# Patient Record
Sex: Male | Born: 1975 | Race: White | Hispanic: No | Marital: Married | Smoking: Current every day smoker
Health system: Southern US, Community
[De-identification: ages and names within clinical notes are randomized; demographics above are authoritative.]

---

## 1999-05-18 ENCOUNTER — Emergency Department (HOSPITAL_COMMUNITY): Admission: EM | Admit: 1999-05-18 | Discharge: 1999-05-18 | Payer: Self-pay | Admitting: Emergency Medicine

## 1999-05-18 ENCOUNTER — Encounter: Payer: Self-pay | Admitting: Emergency Medicine

## 1999-05-26 ENCOUNTER — Emergency Department (HOSPITAL_COMMUNITY): Admission: EM | Admit: 1999-05-26 | Discharge: 1999-05-26 | Payer: Self-pay | Admitting: Emergency Medicine

## 1999-06-08 ENCOUNTER — Emergency Department (HOSPITAL_COMMUNITY): Admission: EM | Admit: 1999-06-08 | Discharge: 1999-06-08 | Payer: Self-pay

## 1999-06-11 ENCOUNTER — Emergency Department (HOSPITAL_COMMUNITY): Admission: EM | Admit: 1999-06-11 | Discharge: 1999-06-11 | Payer: Self-pay

## 2002-04-21 ENCOUNTER — Emergency Department (HOSPITAL_COMMUNITY): Admission: EM | Admit: 2002-04-21 | Discharge: 2002-04-21 | Payer: Self-pay | Admitting: Emergency Medicine

## 2002-07-16 ENCOUNTER — Emergency Department (HOSPITAL_COMMUNITY): Admission: EM | Admit: 2002-07-16 | Discharge: 2002-07-16 | Payer: Self-pay | Admitting: Emergency Medicine

## 2003-07-04 ENCOUNTER — Emergency Department (HOSPITAL_COMMUNITY): Admission: EM | Admit: 2003-07-04 | Discharge: 2003-07-04 | Payer: Self-pay | Admitting: Emergency Medicine

## 2004-04-09 ENCOUNTER — Emergency Department (HOSPITAL_COMMUNITY): Admission: EM | Admit: 2004-04-09 | Discharge: 2004-04-09 | Payer: Self-pay | Admitting: *Deleted

## 2004-09-29 ENCOUNTER — Emergency Department: Payer: Self-pay | Admitting: Emergency Medicine

## 2005-05-28 ENCOUNTER — Encounter: Admission: RE | Admit: 2005-05-28 | Discharge: 2005-05-28 | Payer: Self-pay | Admitting: Occupational Medicine

## 2009-04-01 ENCOUNTER — Ambulatory Visit: Payer: Self-pay | Admitting: Family Medicine

## 2009-04-01 DIAGNOSIS — F329 Major depressive disorder, single episode, unspecified: Secondary | ICD-10-CM

## 2009-04-01 DIAGNOSIS — F17200 Nicotine dependence, unspecified, uncomplicated: Secondary | ICD-10-CM

## 2009-04-01 DIAGNOSIS — L0591 Pilonidal cyst without abscess: Secondary | ICD-10-CM | POA: Insufficient documentation

## 2009-05-04 ENCOUNTER — Ambulatory Visit: Payer: Self-pay | Admitting: Family Medicine

## 2009-05-05 ENCOUNTER — Encounter: Payer: Self-pay | Admitting: Family Medicine

## 2009-10-04 ENCOUNTER — Telehealth: Payer: Self-pay | Admitting: Family Medicine

## 2010-07-21 ENCOUNTER — Emergency Department (HOSPITAL_BASED_OUTPATIENT_CLINIC_OR_DEPARTMENT_OTHER): Admission: EM | Admit: 2010-07-21 | Discharge: 2010-07-22 | Payer: Self-pay | Admitting: Emergency Medicine

## 2010-07-24 ENCOUNTER — Encounter: Payer: Self-pay | Admitting: Family Medicine

## 2011-01-16 NOTE — Letter (Signed)
Summary: [facility]   Imported By: Lanelle Bal 08/08/2010 08:59:08  _____________________________________________________________________  External Attachment:    Type:   Image     Comment:   External Document

## 2011-03-02 LAB — CULTURE, ROUTINE-ABSCESS

## 2011-09-03 ENCOUNTER — Emergency Department: Payer: Self-pay | Admitting: Emergency Medicine

## 2012-04-27 IMAGING — CR DG CHEST 1V PORT
1 series · 1 of 1 positions shown · non-contrast
Comparison: none

REASON FOR EXAM: chest pain
COMMENTS:

PROCEDURE:     DXR - DXR PORTABLE CHEST SINGLE VIEW  - September 03, 2011 [DATE]
RESULT:     The lungs are mildly hypoinflated. There is no focal infiltrate.
The cardiac silhouette is normal in size. The pulmonary vascularity is not
engorged. I see no pleural effusion.

[view not recorded]
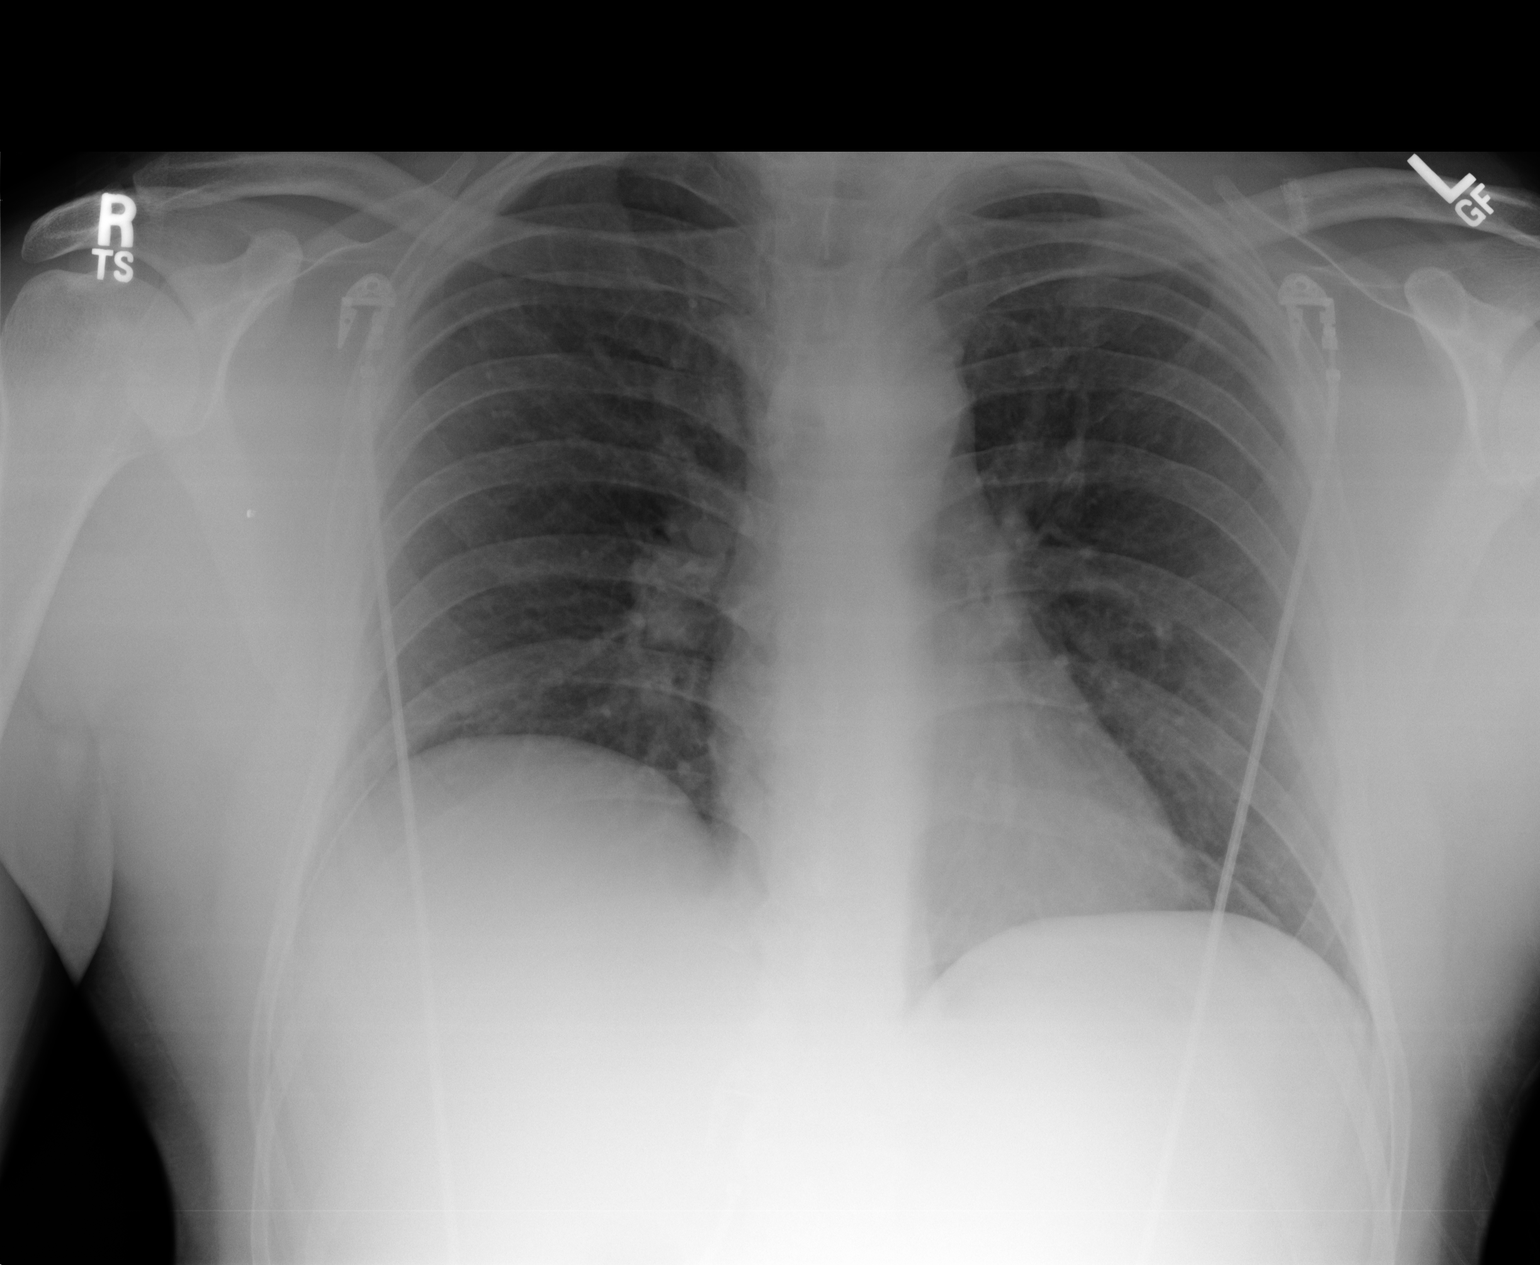

[1 of 1 positions shown; findings below may reference images not displayed]

IMPRESSION: I do not see evidence of acute cardiopulmonary abnormality.
The study is limited due to hypoinflation. Followup PA and lateral chest
films with deep inspiration would be useful if the patient's symptoms
persist.

## 2012-04-28 IMAGING — CT CT CHEST W/ CM
2 series · 15 of 31 positions shown, 19 images · IV contrast (APPLIED)
Comparison: none

REASON FOR EXAM: chest pain tachycardia
COMMENTS:

[Series 4: soft tissue · axial · 0.74mm/px · z∈[-90,-42]mm · 2 of 103 slices shown]
[im 8/103  mediastinal]
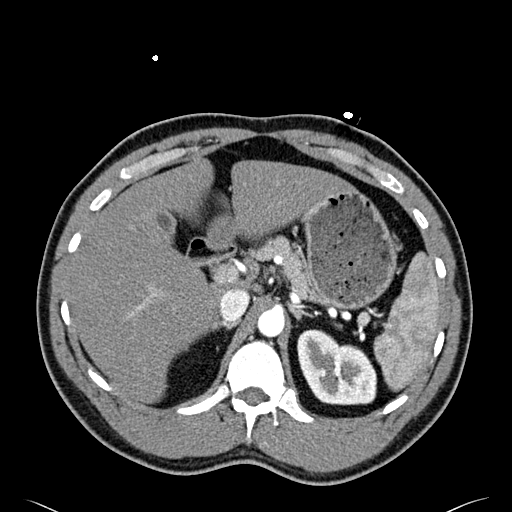
[im 24/103  mediastinal]
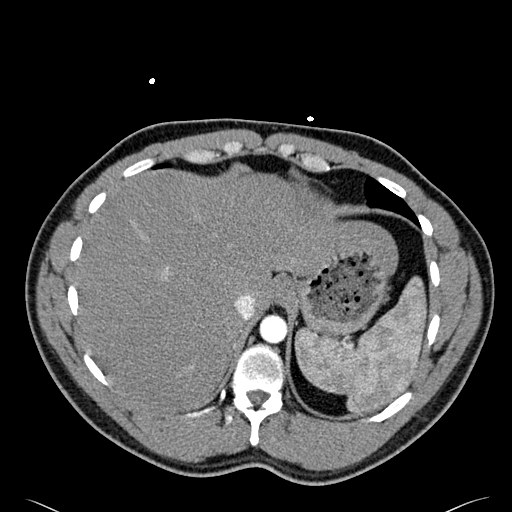

[Series 5: lung windows · axial · 0.74mm/px · z∈[-82,+170]mm · 13 of 100 slices shown, 17 images]
[im 8/100  mediastinal]
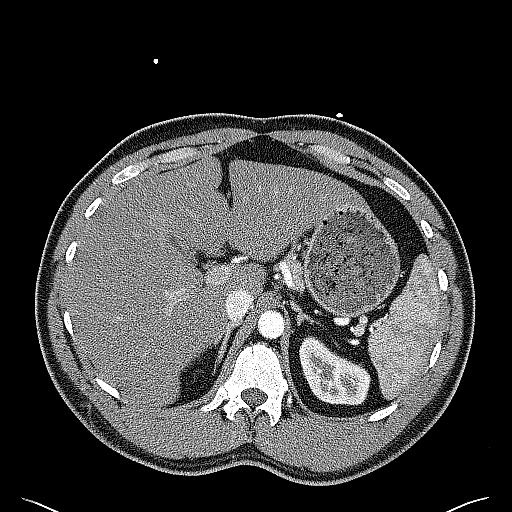
[im 8/100  lung]
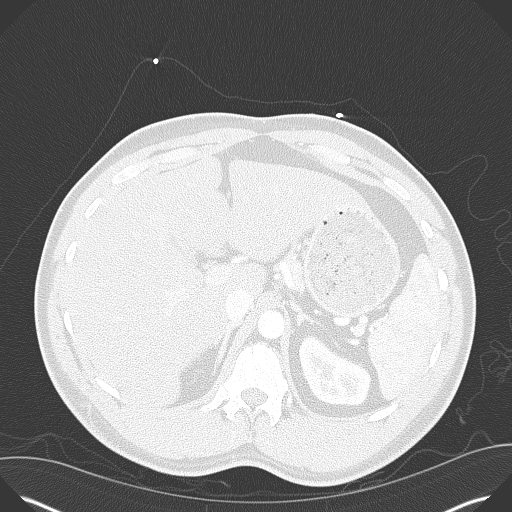
[im 16/100  lung]
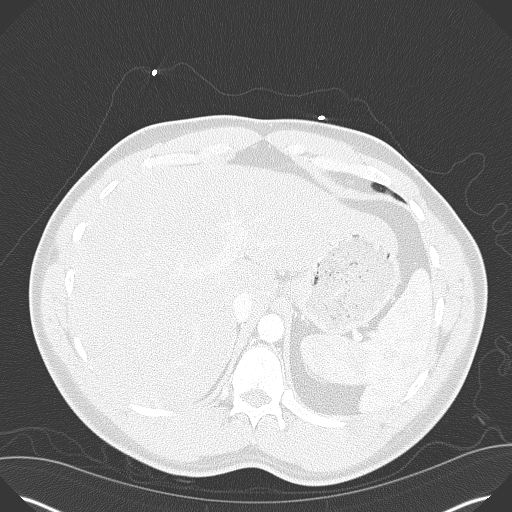
[im 23/100  lung]
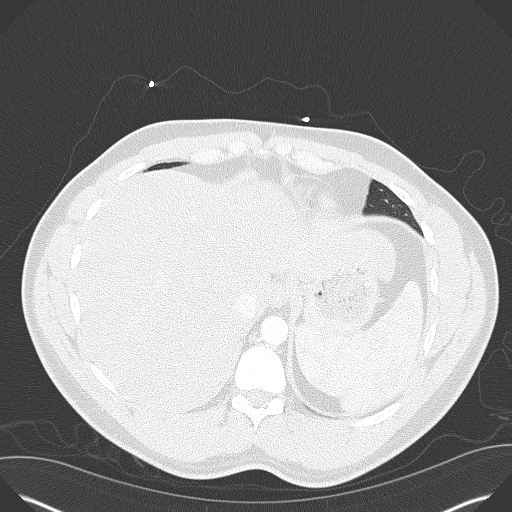
[im 31/100  lung]
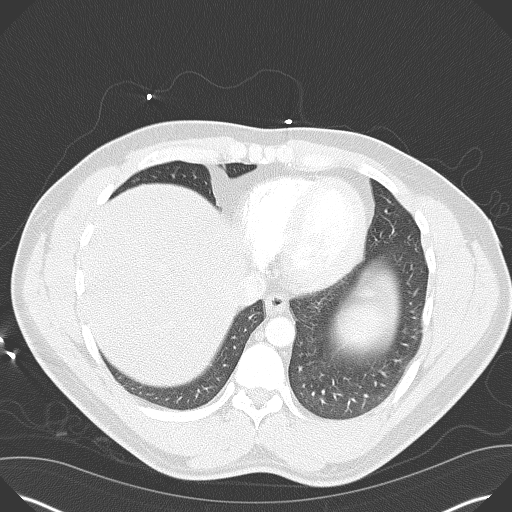
[im 39/100  mediastinal]
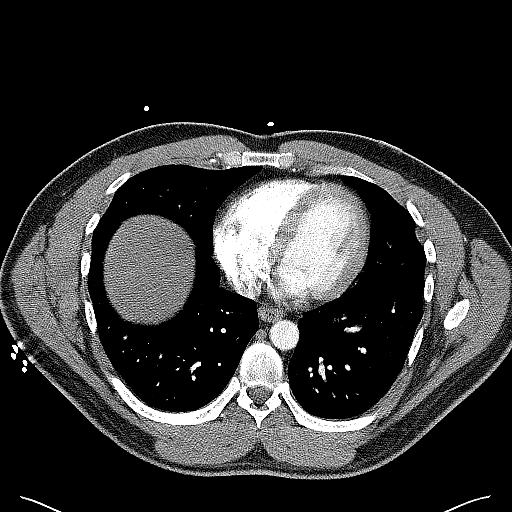
[im 39/100  lung]
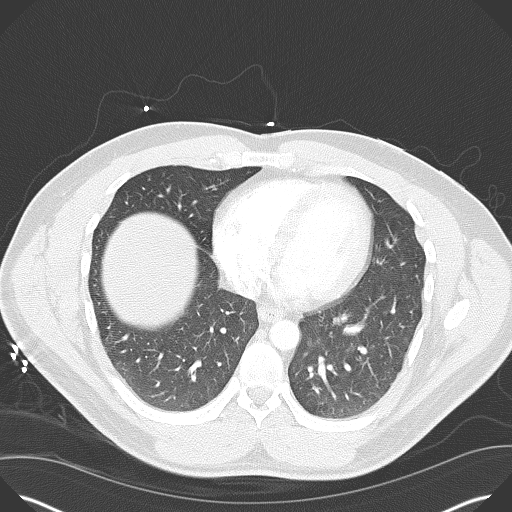
[im 46/100  lung]
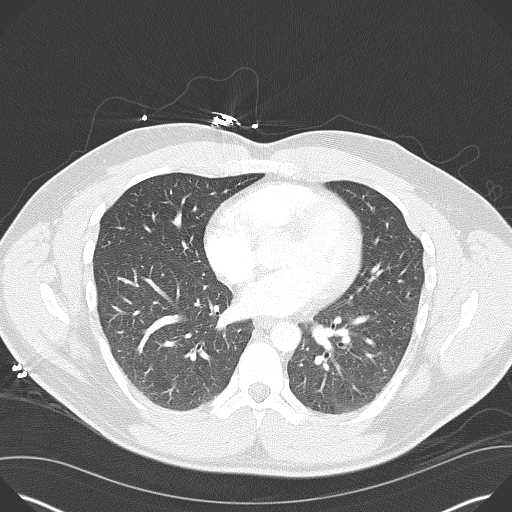
[im 50/100  lung]
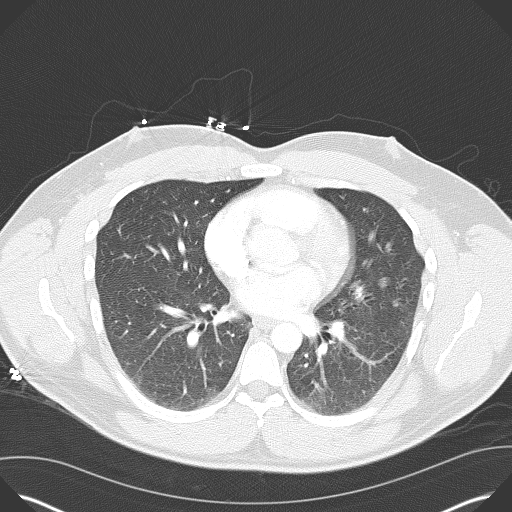
[im 54/100  lung]
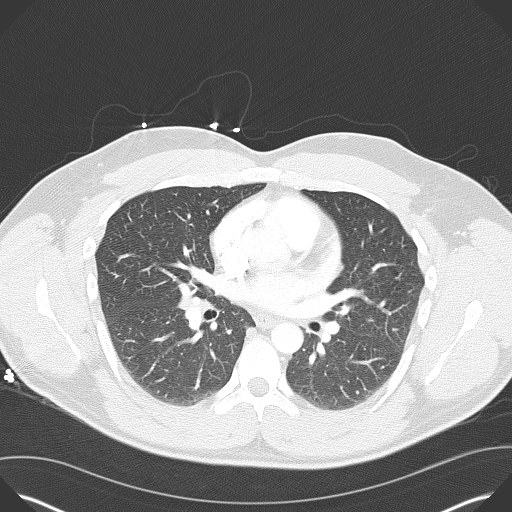
[im 61/100  mediastinal]
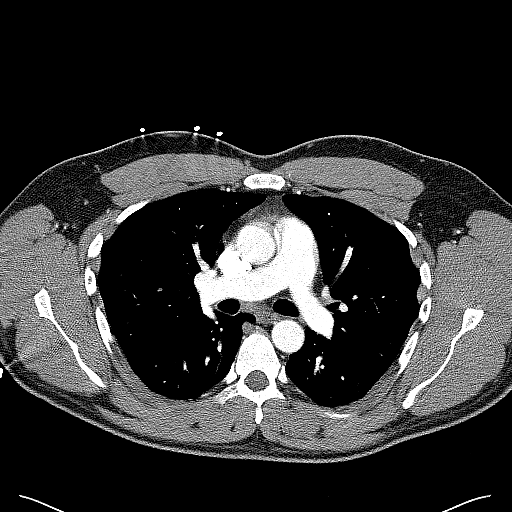
[im 61/100  lung]
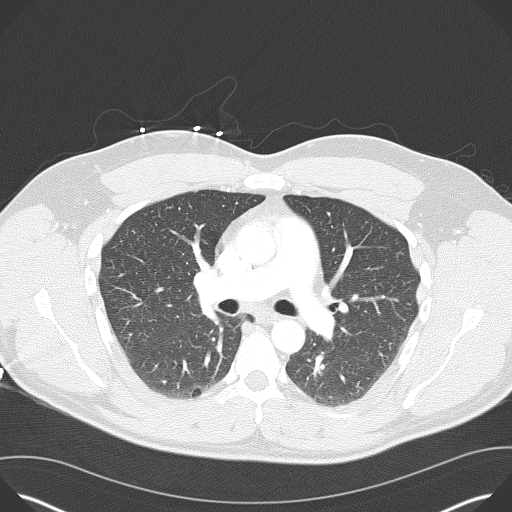
[im 69/100  lung]
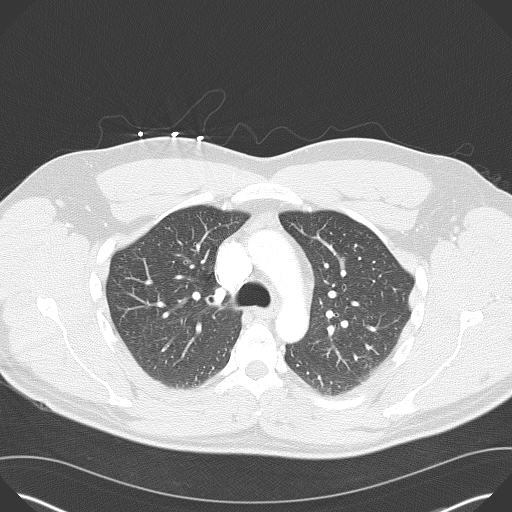
[im 77/100  lung]
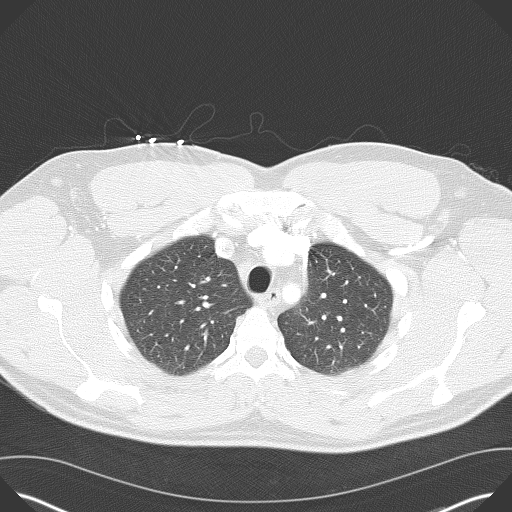
[im 84/100  lung]
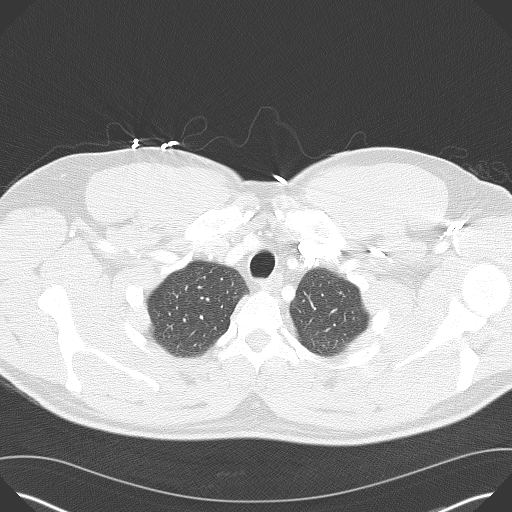
[im 92/100  mediastinal]
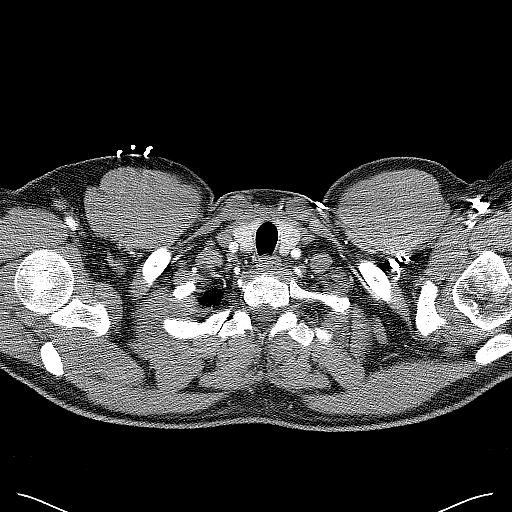
[im 92/100  lung]
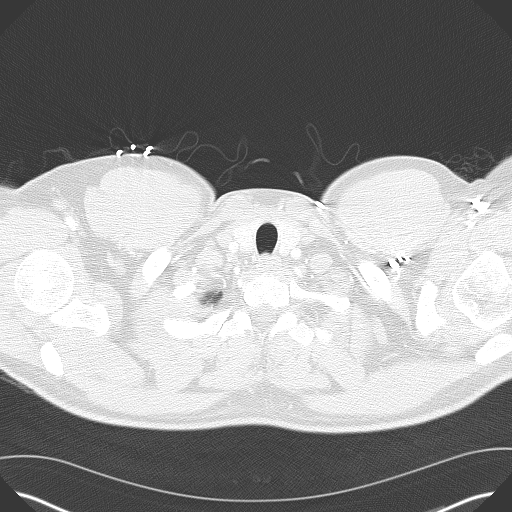

[15 of 31 positions shown; findings below may reference images not displayed]

PROCEDURE:     CT  - CT CHEST (FOR PE) W  - September 04, 2011 [DATE]

RESULT:     Emergent CT of the chest is performed utilizing 100 mL of
[id number] iodinated intravenous contrast with images reconstructed at
mm slice thickness in the axial plane. The patient has no previous exam for
comparison.

Dependent atelectasis is present. There is a 2 mm subpleural nodular density
anteriorly in the right lung on image #46 with smooth margins and no CT
evidence of calcification. The thoracic aorta is normal in caliber without
dissection. The pulmonary arteries demonstrate no evidence of filling
defect. There is no pleural or pericardial effusion. The upper abdominal
viscera included on the study appear within normal limits. The bony
structures are unremarkable.
IMPRESSION: 1. Minimal atelectasis in the lung bases. Tiny subpleural nodular density as
described. Correlate with risk factors. Note is also made but not mentioned
above of a small bleb in the subpleural region on image #41 in the right
lung posteriorly. This measures 7.0 mm in diameter.
2. No pulmonary embolism.
3. No thoracic aortic aneurysm or dissection.
4. No mediastinal or hilar mass or adenopathy.

## 2015-01-06 ENCOUNTER — Emergency Department (HOSPITAL_COMMUNITY): Payer: Self-pay

## 2015-01-06 ENCOUNTER — Encounter (HOSPITAL_COMMUNITY): Payer: Self-pay | Admitting: *Deleted

## 2015-01-06 ENCOUNTER — Emergency Department (HOSPITAL_COMMUNITY)
Admission: EM | Admit: 2015-01-06 | Discharge: 2015-01-07 | Disposition: A | Discharge status: Home / Self Care | Payer: Self-pay | Attending: Emergency Medicine | Admitting: Emergency Medicine

## 2015-01-06 DIAGNOSIS — R739 Hyperglycemia, unspecified: Secondary | ICD-10-CM | POA: Insufficient documentation

## 2015-01-06 DIAGNOSIS — R0789 Other chest pain: Secondary | ICD-10-CM | POA: Insufficient documentation

## 2015-01-06 DIAGNOSIS — Z720 Tobacco use: Secondary | ICD-10-CM | POA: Insufficient documentation

## 2015-01-06 DIAGNOSIS — R112 Nausea with vomiting, unspecified: Secondary | ICD-10-CM | POA: Insufficient documentation

## 2015-01-06 DIAGNOSIS — R079 Chest pain, unspecified: Secondary | ICD-10-CM

## 2015-01-06 DIAGNOSIS — R197 Diarrhea, unspecified: Secondary | ICD-10-CM | POA: Insufficient documentation

## 2015-01-06 LAB — I-STAT TROPONIN, ED
TROPONIN I, POC: 0 ng/mL (ref 0.00–0.08)
TROPONIN I, POC: 0 ng/mL (ref 0.00–0.08)

## 2015-01-06 LAB — URINALYSIS, ROUTINE W REFLEX MICROSCOPIC
Bilirubin Urine: NEGATIVE
Glucose, UA: >1000 mg/dL — AB
Hgb urine dipstick: NEGATIVE
Ketones, ur: NEGATIVE mg/dL
Leukocytes, UA: NEGATIVE
Nitrite: NEGATIVE
PH: 5 (ref 5.0–8.0)
Protein, ur: NEGATIVE mg/dL
Specific Gravity, Urine: 1.036 — ABNORMAL HIGH (ref 1.005–1.030)
Urobilinogen, UA: 0.2 mg/dL (ref 0.0–1.0)

## 2015-01-06 LAB — HEPATIC FUNCTION PANEL
ALT: 54 U/L — ABNORMAL HIGH (ref 0–53)
AST: 36 U/L (ref 0–37)
Albumin: 4.5 g/dL (ref 3.5–5.2)
Alkaline Phosphatase: 118 U/L — ABNORMAL HIGH (ref 39–117)
Bilirubin, Direct: 0.2 mg/dL (ref 0.0–0.5)
Indirect Bilirubin: 0.9 mg/dL (ref 0.3–0.9)
Total Bilirubin: 1.1 mg/dL (ref 0.3–1.2)
Total Protein: 7.2 g/dL (ref 6.0–8.3)

## 2015-01-06 LAB — URINE MICROSCOPIC-ADD ON

## 2015-01-06 LAB — CBC
HCT: 48.7 % (ref 39.0–52.0)
HEMOGLOBIN: 17.7 g/dL — AB (ref 13.0–17.0)
MCH: 30.5 pg (ref 26.0–34.0)
MCHC: 36.3 g/dL — AB (ref 30.0–36.0)
MCV: 84 fL (ref 78.0–100.0)
PLATELETS: 219 10*3/uL (ref 150–400)
RBC: 5.8 MIL/uL (ref 4.22–5.81)
RDW: 13 % (ref 11.5–15.5)
WBC: 13.7 10*3/uL — ABNORMAL HIGH (ref 4.0–10.5)

## 2015-01-06 LAB — CBG MONITORING, ED
Glucose-Capillary: 216 mg/dL — ABNORMAL HIGH (ref 70–99)
Glucose-Capillary: 221 mg/dL — ABNORMAL HIGH (ref 70–99)

## 2015-01-06 LAB — LIPASE, BLOOD: LIPASE: 48 U/L (ref 11–59)

## 2015-01-06 LAB — DIFFERENTIAL
BASOS ABS: 0 10*3/uL (ref 0.0–0.1)
Basophils Relative: 0 % (ref 0–1)
EOS ABS: 0.1 10*3/uL (ref 0.0–0.7)
Eosinophils Relative: 0 % (ref 0–5)
Lymphocytes Relative: 7 % — ABNORMAL LOW (ref 12–46)
Lymphs Abs: 0.8 10*3/uL (ref 0.7–4.0)
Monocytes Absolute: 0.4 10*3/uL (ref 0.1–1.0)
Monocytes Relative: 4 % (ref 3–12)
Neutro Abs: 10.1 10*3/uL — ABNORMAL HIGH (ref 1.7–7.7)
Neutrophils Relative %: 89 % — ABNORMAL HIGH (ref 43–77)

## 2015-01-06 LAB — BRAIN NATRIURETIC PEPTIDE: B Natriuretic Peptide: 18.7 pg/mL (ref 0.0–100.0)

## 2015-01-06 LAB — BASIC METABOLIC PANEL
ANION GAP: 11 (ref 5–15)
BUN: 8 mg/dL (ref 6–23)
CO2: 26 mmol/L (ref 19–32)
CREATININE: 1.22 mg/dL (ref 0.50–1.35)
Calcium: 9.5 mg/dL (ref 8.4–10.5)
Chloride: 98 mEq/L (ref 96–112)
GFR calc non Af Amer: 75 mL/min — ABNORMAL LOW (ref >90)
GFR, EST AFRICAN AMERICAN: 87 mL/min — AB (ref >90)
GLUCOSE: 226 mg/dL — AB (ref 70–99)
POTASSIUM: 3.1 mmol/L — AB (ref 3.5–5.1)
SODIUM: 135 mmol/L (ref 135–145)

## 2015-01-06 MED ORDER — SODIUM CHLORIDE 0.9 % IV BOLUS (SEPSIS)
1000.0000 mL | Freq: Once | INTRAVENOUS | Status: AC
  Administered 2015-01-06: 1000 mL via INTRAVENOUS

## 2015-01-06 MED ORDER — ONDANSETRON HCL 4 MG/2ML IJ SOLN
4.0000 mg | Freq: Once | INTRAMUSCULAR | Status: AC
  Administered 2015-01-06: 4 mg via INTRAVENOUS
  Filled 2015-01-06: qty 2

## 2015-01-06 MED ORDER — KETOROLAC TROMETHAMINE 30 MG/ML IJ SOLN
30.0000 mg | Freq: Once | INTRAMUSCULAR | Status: AC
  Administered 2015-01-06: 30 mg via INTRAVENOUS
  Filled 2015-01-06: qty 1

## 2015-01-06 NOTE — ED Notes (Signed)
Pt c/o centralized chest pain radiating to back of neck. Pt also reports nausea, shortness of breath and dizziness. Pt was watching a movie when onset occurred.

## 2015-01-06 NOTE — ED Provider Notes (Signed)
TIME SEEN: 8:53 PM  CHIEF COMPLAINT: Nausea, vomiting, diarrhea, chest pain and shortness of breath  HPI: Pt is a 39 y.o. male with history of hyperglycemia that is diet controlled who presents to the emergency Department nausea, vomiting, diarrhea that started earlier today. Reports he was in a movie theater around 8 PM when he felt very nauseous and this made him panic and he began having chest tightness and shortness of breath. States he has felt lightheaded with standing. Denies abdominal pain. No fevers or chills. No sick contacts or recent travel. No dysuria or hematuria. No bloody stool or melena. Reports he has had several weeks of polydipsia, polyuria. No history of cardiac disease. He is a smoker. No history of hypertension, hyperlipidemia. No prior history of PE or DVT, recent prolonged immobilization, fracture, surgery, trauma. No leg swelling or pain.  ROS: See HPI Constitutional: no fever  Eyes: no drainage  ENT: no runny nose   Cardiovascular:  chest pain  Resp:  SOB  GI:  vomiting GU: no dysuria Integumentary: no rash  Allergy: no hives  Musculoskeletal: no leg swelling  Neurological: no slurred speech ROS otherwise negative  PAST MEDICAL HISTORY/PAST SURGICAL HISTORY:  History reviewed. No pertinent past medical history.  MEDICATIONS:  Prior to Admission medications   Not on File    ALLERGIES:  Allergies  Allergen Reactions  . Benadryl [Diphenhydramine Hcl (Sleep)] Hives    Allergic to red dye    SOCIAL HISTORY:  History  Substance Use Topics  . Smoking status: Current Every Day Smoker  . Smokeless tobacco: Never Used  . Alcohol Use: No    FAMILY HISTORY: History reviewed. No pertinent family history.  EXAM: BP 112/66 mmHg  Pulse 86  Temp(Src) 98.6 F (37 C) (Oral)  Resp 16  SpO2 100% CONSTITUTIONAL: Alert and oriented and responds appropriately to questions. Well-appearing; well-nourished HEAD: Normocephalic EYES: Conjunctivae clear,  PERRL ENT: normal nose; no rhinorrhea; moist mucous membranes; pharynx without lesions noted NECK: Supple, no meningismus, no LAD  CARD: RRR; S1 and S2 appreciated; no murmurs, no clicks, no rubs, no gallops RESP: Normal chest excursion without splinting or tachypnea; breath sounds clear and equal bilaterally; no wheezes, no rhonchi, no rales,  ABD/GI: Normal bowel sounds; non-distended; soft, non-tender, no rebound, no guarding BACK:  The back appears normal and is non-tender to palpation, there is no CVA tenderness EXT: Normal ROM in all joints; non-tender to palpation; no edema; normal capillary refill; no cyanosis; no calf tenderness or swelling  SKIN: Normal color for age and race; warm NEURO: Moves all extremities equally PSYCH: The patient's mood and manner are appropriate. Grooming and personal hygiene are appropriate.  MEDICAL DECISION MAKING: Patient here with nausea, vomiting and diarrhea with a benign abdominal exam. Suspect viral illness. Suspect that he began to feel anxious because of these symptoms and that is causing his chest pain or shortness of breath. He does have some T-wave inversions in leads 3 and aVF but no other ischemic changes. He has no risk factors for ACS other than tobacco use and history of diet-controlled hyperglycemia. Blood glucose here is mildly elevated. Will give IV fluids, Toradol, Zofran and reassess. We'll check basic labs, urine.  Given his abdominal exam is benign I do not feel he needs abdominal imaging at this time.  ED PROGRESS: Patient's labs show mild leukocytosis with left shift. Abdominal exam still benign. Blood glucose mildly elevated but bicarbonate and anion gap normal. Urine shows no sign of infection and no  ketones. Reports feeling much better after IV fluids, Toradol and Zofran. Is tolerating by mouth. He has had 2 negative troponins and a clear chest x-ray. I feel he is safe to be discharged home. Given he is hyperglycemic we'll start him on  metformin 500 mg twice a day. He does have a primary care physician for follow-up. Have advised him to schedule an appointment for next week. Discussed return precautions. We'll discharge with prescriptions for Phenergan, Zofran, Imodium and metformin. Patient and significant other at bedside verbalize understanding and are comfortable with plan.    Date: 01/06/2015 20:31  Rate: 93  Rhythm: normal sinus rhythm  QRS Axis: normal  Intervals: normal  ST/T Wave abnormalities: normal  Conduction Disutrbances: none  Narrative Interpretation: Sinus rhythm, T-wave inversions in III and aVF      Layla MawKristen N Nickie Warwick, DO 01/07/15 0102

## 2015-01-07 MED ORDER — ONDANSETRON 4 MG PO TBDP: 4.0000 mg | ORAL_TABLET | Freq: Three times a day (TID) | ORAL | Status: AC | PRN

## 2015-01-07 MED ORDER — PROMETHAZINE HCL 25 MG PO TABS: 25.0000 mg | ORAL_TABLET | Freq: Four times a day (QID) | ORAL | Status: AC | PRN

## 2015-01-07 MED ORDER — LOPERAMIDE HCL 2 MG PO CAPS: 2.0000 mg | ORAL_CAPSULE | Freq: Four times a day (QID) | ORAL | Status: AC | PRN

## 2015-01-07 MED ORDER — METFORMIN HCL 500 MG PO TABS: 500.0000 mg | ORAL_TABLET | Freq: Two times a day (BID) | ORAL | Status: AC

## 2015-01-07 NOTE — Discharge Instructions (Signed)
Viral Gastroenteritis °Viral gastroenteritis is also known as stomach flu. This condition affects the stomach and intestinal tract. It can cause sudden diarrhea and vomiting. The illness typically lasts 3 to 8 days. Most people develop an immune response that eventually gets rid of the virus. While this natural response develops, the virus can make you quite ill. °CAUSES  °Many different viruses can cause gastroenteritis, such as rotavirus or noroviruses. You can catch one of these viruses by consuming contaminated food or water. You may also catch a virus by sharing utensils or other personal items with an infected person or by touching a contaminated surface. °SYMPTOMS  °The most common symptoms are diarrhea and vomiting. These problems can cause a severe loss of body fluids (dehydration) and a body salt (electrolyte) imbalance. Other symptoms may include: °· Fever. °· Headache. °· Fatigue. °· Abdominal pain. °DIAGNOSIS  °Your caregiver can usually diagnose viral gastroenteritis based on your symptoms and a physical exam. A stool sample may also be taken to test for the presence of viruses or other infections. °TREATMENT  °This illness typically goes away on its own. Treatments are aimed at rehydration. The most serious cases of viral gastroenteritis involve vomiting so severely that you are not able to keep fluids down. In these cases, fluids must be given through an intravenous line (IV). °HOME CARE INSTRUCTIONS  °· Drink enough fluids to keep your urine clear or pale yellow. Drink small amounts of fluids frequently and increase the amounts as tolerated. °· Ask your caregiver for specific rehydration instructions. °· Avoid: °· Foods high in sugar. °· Alcohol. °· Carbonated drinks. °· Tobacco. °· Juice. °· Caffeine drinks. °· Extremely hot or cold fluids. °· Fatty, greasy foods. °· Too much intake of anything at one time. °· Dairy products until 24 to 48 hours after diarrhea stops. °· You may consume probiotics.  Probiotics are active cultures of beneficial bacteria. They may lessen the amount and number of diarrheal stools in adults. Probiotics can be found in yogurt with active cultures and in supplements. °· Wash your hands well to avoid spreading the virus. °· Only take over-the-counter or prescription medicines for pain, discomfort, or fever as directed by your caregiver. Do not give aspirin to children. Antidiarrheal medicines are not recommended. °· Ask your caregiver if you should continue to take your regular prescribed and over-the-counter medicines. °· Keep all follow-up appointments as directed by your caregiver. °SEEK IMMEDIATE MEDICAL CARE IF:  °· You are unable to keep fluids down. °· You do not urinate at least once every 6 to 8 hours. °· You develop shortness of breath. °· You notice blood in your stool or vomit. This may look like coffee grounds. °· You have abdominal pain that increases or is concentrated in one small area (localized). °· You have persistent vomiting or diarrhea. °· You have a fever. °· The patient is a child younger than 3 months, and he or she has a fever. °· The patient is a child older than 3 months, and he or she has a fever and persistent symptoms. °· The patient is a child older than 3 months, and he or she has a fever and symptoms suddenly get worse. °· The patient is a baby, and he or she has no tears when crying. °MAKE SURE YOU:  °· Understand these instructions. °· Will watch your condition. °· Will get help right away if you are not doing well or get worse. °Document Released: 12/03/2005 Document Revised: 02/25/2012 Document Reviewed: 09/19/2011 °  [employer] Patient Information 2015 [employer] [place]. This information is not intended to replace advice given to you by your health care provider. Make sure you discuss any questions you have with your health care provider.    Chest Pain (Nonspecific) It is often hard to give a specific diagnosis for the cause of chest pain. There  is always a chance that your pain could be related to something serious, such as a heart attack or a blood clot in the lungs. You need to follow up with your health care provider for further evaluation. CAUSES   Heartburn.  Pneumonia or bronchitis.  Anxiety or stress.  Inflammation around your heart (pericarditis) or lung (pleuritis or pleurisy).  A blood clot in the lung.  A collapsed lung (pneumothorax). It can develop suddenly on its own (spontaneous pneumothorax) or from trauma to the chest.  Shingles infection (herpes zoster virus). The chest wall is composed of bones, muscles, and cartilage. Any of these can be the source of the pain.  The bones can be bruised by injury.  The muscles or cartilage can be strained by coughing or overwork.  The cartilage can be affected by inflammation and become sore (costochondritis). DIAGNOSIS  Lab tests or other studies may be needed to find the cause of your pain. Your health care provider may have you take a test called an ambulatory electrocardiogram (ECG). An ECG records your heartbeat patterns over a 24-hour period. You may also have other tests, such as:  Transthoracic echocardiogram (TTE). During echocardiography, sound waves are used to evaluate how blood flows through your heart.  Transesophageal echocardiogram (TEE).  Cardiac monitoring. This allows your health care provider to monitor your heart rate and rhythm in real time.  Holter monitor. This is a portable device that records your heartbeat and can help diagnose heart arrhythmias. It allows your health care provider to track your heart activity for several days, if needed.  Stress tests by exercise or by giving medicine that makes the heart beat faster. TREATMENT   Treatment depends on what may be causing your chest pain. Treatment may include:  Acid blockers for heartburn.  Anti-inflammatory medicine.  Pain medicine for inflammatory conditions.  Antibiotics if an  infection is present.  You may be advised to change lifestyle habits. This includes stopping smoking and avoiding alcohol, caffeine, and chocolate.  You may be advised to keep your head raised (elevated) when sleeping. This reduces the chance of acid going backward from your stomach into your esophagus. Most of the time, nonspecific chest pain will improve within 2-3 days with rest and mild pain medicine.  HOME CARE INSTRUCTIONS   If antibiotics were prescribed, take them as directed. Finish them even if you start to feel better.  For the next few days, avoid physical activities that bring on chest pain. Continue physical activities as directed.  Do not use any tobacco products, including cigarettes, chewing tobacco, or electronic cigarettes.  Avoid drinking alcohol.  Only take medicine as directed by your health care provider.  Follow your health care provider's suggestions for further testing if your chest pain does not go away.  Keep any follow-up appointments you made. If you do not go to an appointment, you could develop lasting (chronic) problems with pain. If there is any problem keeping an appointment, call to reschedule. SEEK MEDICAL CARE IF:   Your chest pain does not go away, even after treatment.  You have a rash with blisters on your chest.  You have a fever.  SEEK IMMEDIATE MEDICAL CARE IF:   You have increased chest pain or pain that spreads to your arm, neck, jaw, back, or abdomen.  You have shortness of breath.  You have an increasing cough, or you cough up blood.  You have severe back or abdominal pain.  You feel nauseous or vomit.  You have severe weakness.  You faint.  You have chills. This is an emergency. Do not wait to see if the pain will go away. Get medical help at once. Call your local emergency services (911 in [place] Do not drive yourself to the hospital. MAKE SURE YOU:   Understand these instructions.  Will watch your condition.  Will  get help right away if you are not doing well or get worse. Document Released: 09/12/2005 Document Revised: 12/08/2013 Document Reviewed: 07/08/2008 [facility] Patient Information 2015 [place], [place]. This information is not intended to replace advice given to you by your health care provider. Make sure you discuss any questions you have with your health care provider.    Hyperglycemia Hyperglycemia occurs when the glucose (sugar) in your blood is too high. Hyperglycemia can happen for many reasons, but it most often happens to people who do not know they have diabetes or are not managing their diabetes properly.  CAUSES  Whether you have diabetes or not, there are other causes of hyperglycemia. Hyperglycemia can occur when you have diabetes, but it can also occur in other situations that you might not be as aware of, such as: Diabetes  If you have diabetes and are having problems controlling your blood glucose, hyperglycemia could occur because of some of the following reasons:  Not following your meal plan.  Not taking your diabetes medications or not taking it properly.  Exercising less or doing less activity than you normally do.  Being sick. Pre-diabetes  This cannot be ignored. Before people develop Type 2 diabetes, they almost always have "pre-diabetes." This is when your blood glucose levels are higher than normal, but not yet high enough to be diagnosed as diabetes. Research has shown that some long-term damage to the body, especially the heart and circulatory system, may already be occurring during pre-diabetes. If you take action to manage your blood glucose when you have pre-diabetes, you may delay or prevent Type 2 diabetes from developing. Stress  If you have diabetes, you may be "diet" controlled or on oral medications or insulin to control your diabetes. However, you may find that your blood glucose is higher than usual in the hospital whether you have diabetes or not. This  is often referred to as "stress hyperglycemia." Stress can elevate your blood glucose. This happens because of hormones put out by the body during times of stress. If stress has been the cause of your high blood glucose, it can be followed regularly by your caregiver. That way he/she can make sure your hyperglycemia does not continue to get worse or progress to diabetes. Steroids  Steroids are medications that act on the infection fighting system (immune system) to block inflammation or infection. One side effect can be a rise in blood glucose. Most people can produce enough extra insulin to allow for this rise, but for those who cannot, steroids make blood glucose levels go even higher. It is not unusual for steroid treatments to "uncover" diabetes that is developing. It is not always possible to determine if the hyperglycemia will go away after the steroids are stopped. A special blood test called an A1c is sometimes done to  determine if your blood glucose was elevated before the steroids were started. SYMPTOMS  Thirsty.  Frequent urination.  Dry mouth.  Blurred vision.  Tired or fatigue.  Weakness.  Sleepy.  Tingling in feet or leg. DIAGNOSIS  Diagnosis is made by monitoring blood glucose in one or all of the following ways:  A1c test. This is a chemical found in your blood.  Fingerstick blood glucose monitoring.  Laboratory results. TREATMENT  First, knowing the cause of the hyperglycemia is important before the hyperglycemia can be treated. Treatment may include, but is not be limited to:  Education.  Change or adjustment in medications.  Change or adjustment in meal plan.  Treatment for an illness, infection, etc.  More frequent blood glucose monitoring.  Change in exercise plan.  Decreasing or stopping steroids.  Lifestyle changes. HOME CARE INSTRUCTIONS   Test your blood glucose as directed.  Exercise regularly. Your caregiver will give you instructions  about exercise. Pre-diabetes or diabetes which comes on with stress is helped by exercising.  Eat wholesome, balanced meals. Eat often and at regular, fixed times. Your caregiver or nutritionist will give you a meal plan to guide your sugar intake.  Being at an ideal weight is important. If needed, losing as little as 10 to 15 pounds may help improve blood glucose levels. SEEK MEDICAL CARE IF:   You have questions about medicine, activity, or diet.  You continue to have symptoms (problems such as increased thirst, urination, or weight gain). SEEK IMMEDIATE MEDICAL CARE IF:   You are vomiting or have diarrhea.  Your breath smells fruity.  You are breathing faster or slower.  You are very sleepy or incoherent.  You have numbness, tingling, or pain in your feet or hands.  You have chest pain.  Your symptoms get worse even though you have been following your caregiver's orders.  If you have any other questions or concerns. Document Released: 05/29/2001 Document Revised: 02/25/2012 Document Reviewed: 03/31/2012 [facility] Patient Information 2015 [place], [place]. This information is not intended to replace advice given to you by your health care provider. Make sure you discuss any questions you have with your health care provider.

## 2015-08-31 IMAGING — CR DG CHEST 2V
2 series · 2 of 2 positions shown · non-contrast
Comparison: None.

CLINICAL DATA: Chest pain radiating to the back of the neck

EXAM:
CHEST  2 VIEW

[chest pa]
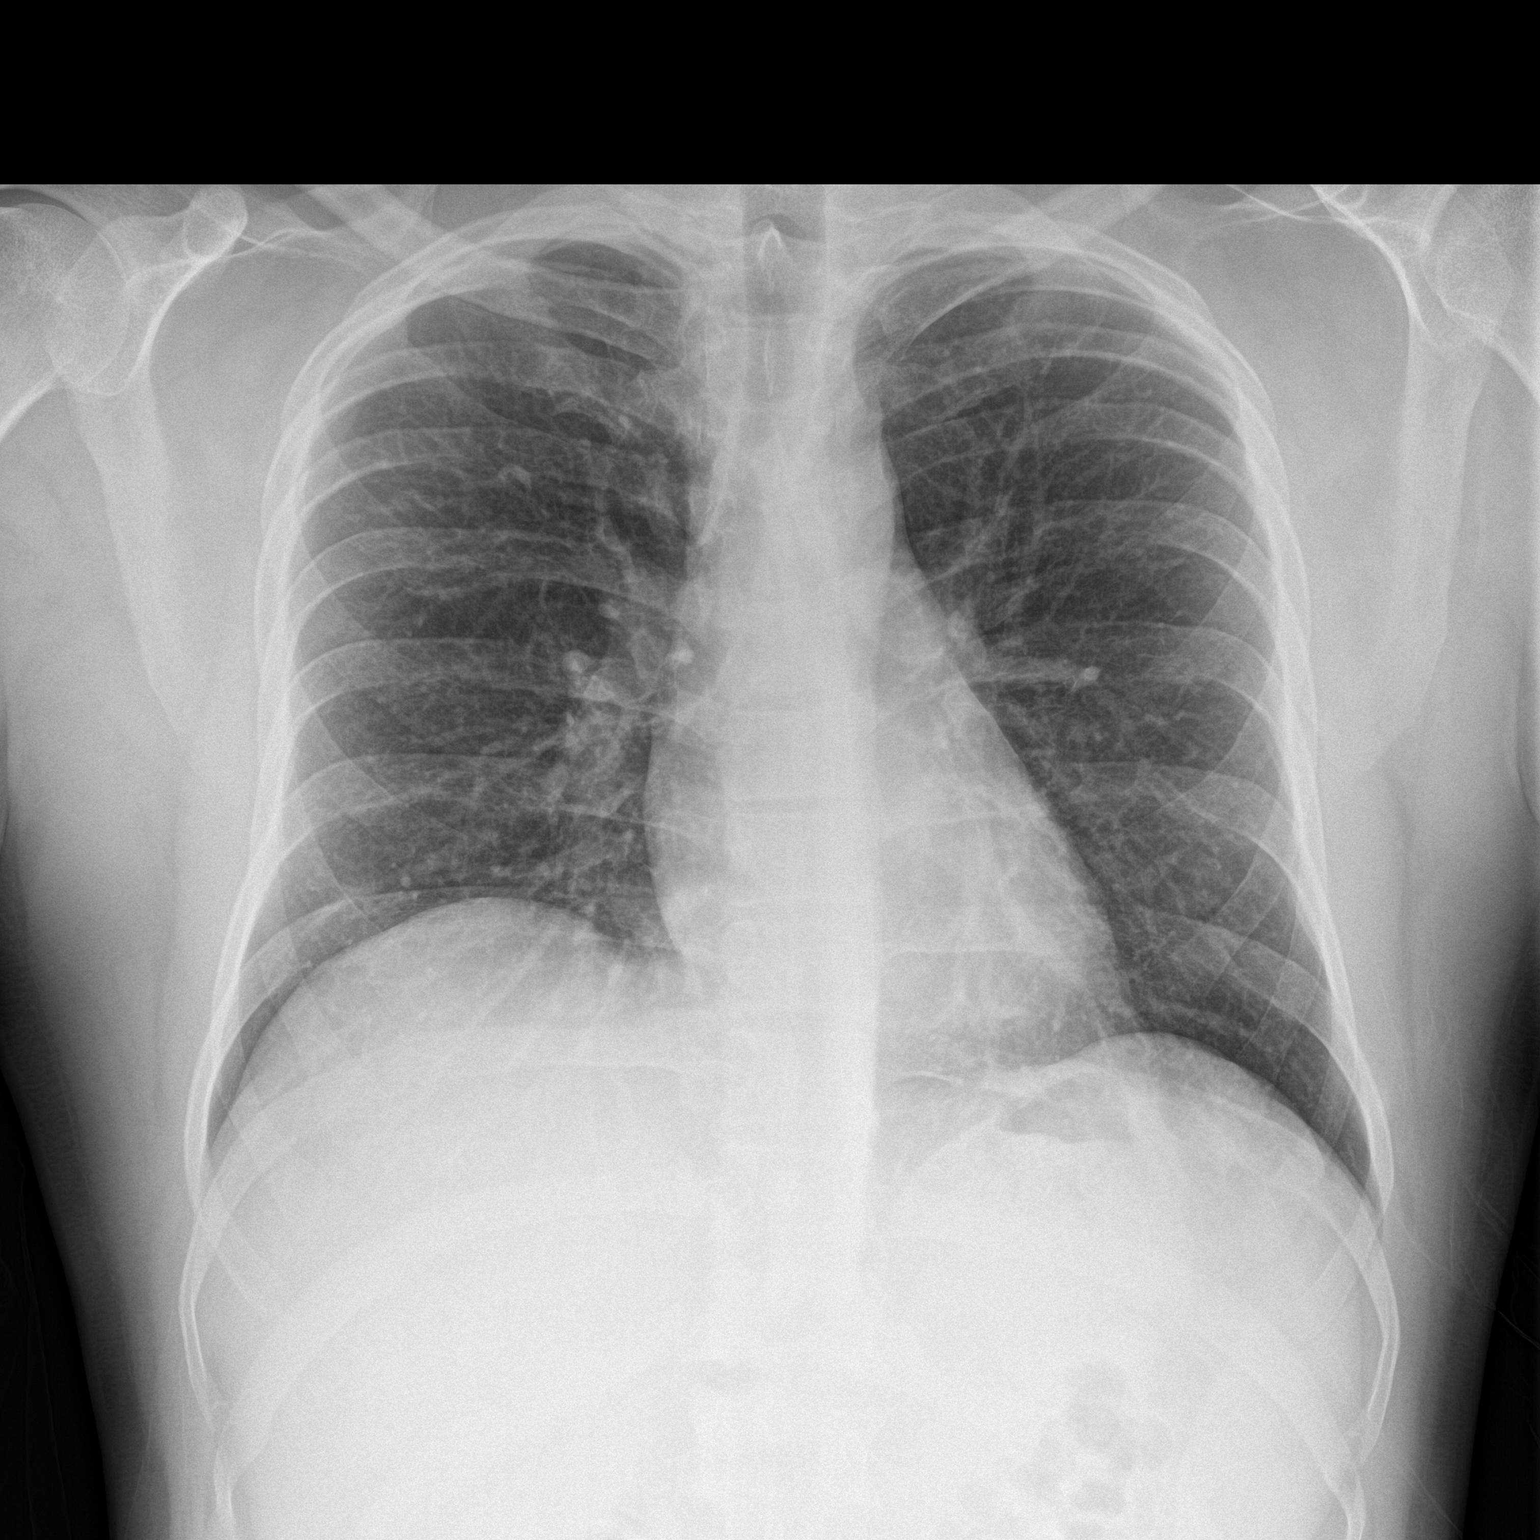

[chest lat]
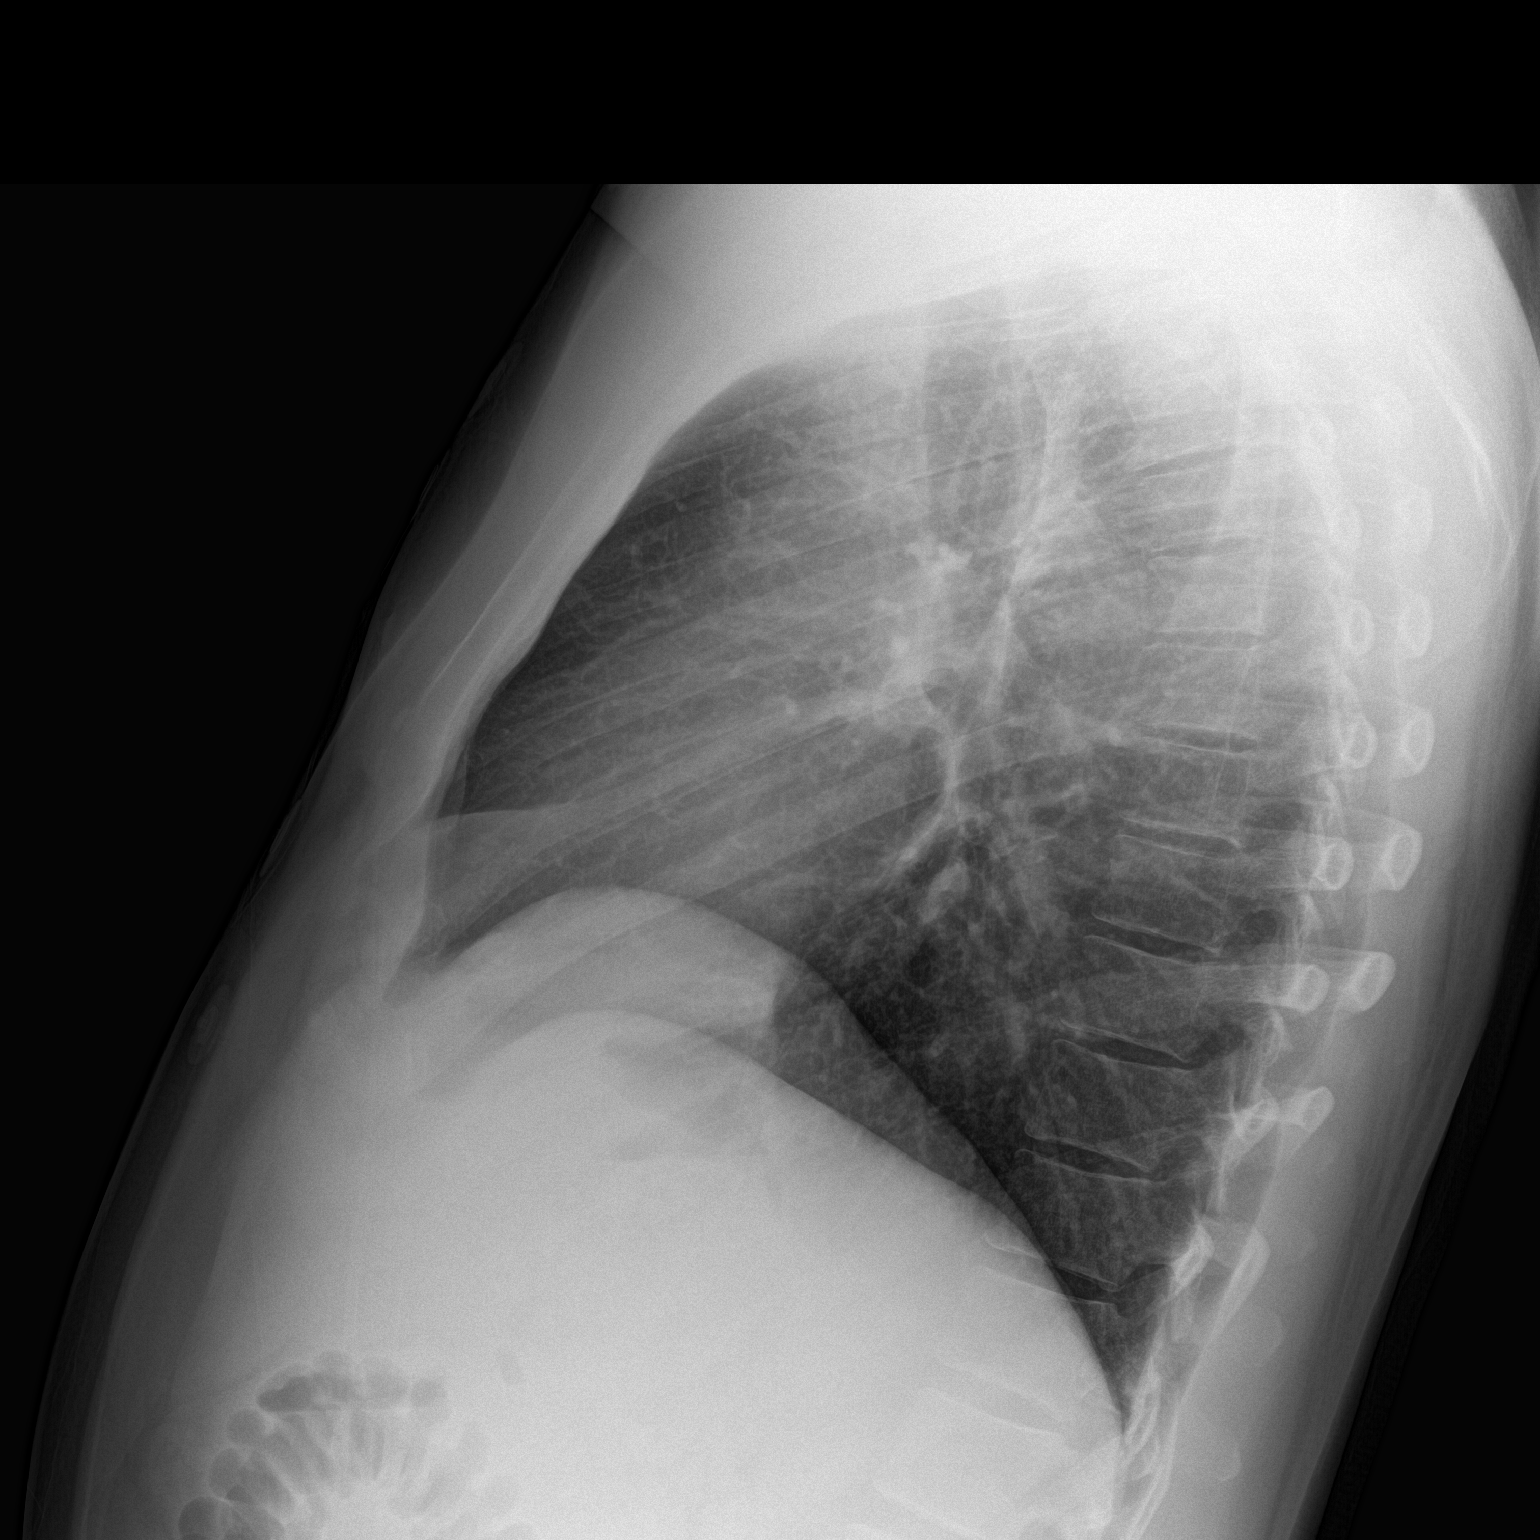

[2 of 2 positions shown; findings below may reference images not displayed]

FINDINGS: The heart size and mediastinal contours are within normal limits.
Both lungs are clear. The visualized skeletal structures are
unremarkable.
IMPRESSION: No active cardiopulmonary disease.
